# Patient Record
Sex: Female | Born: 1960 | Hispanic: No | Smoking: Never smoker
Health system: Southern US, Community
[De-identification: ages and names within clinical notes are randomized; demographics above are authoritative.]

## PROBLEM LIST (undated history)

## (undated) DIAGNOSIS — Z9989 Dependence on other enabling machines and devices: Secondary | ICD-10-CM

## (undated) DIAGNOSIS — G4733 Obstructive sleep apnea (adult) (pediatric): Secondary | ICD-10-CM

## (undated) DIAGNOSIS — E785 Hyperlipidemia, unspecified: Secondary | ICD-10-CM

## (undated) DIAGNOSIS — M1990 Unspecified osteoarthritis, unspecified site: Secondary | ICD-10-CM

## (undated) DIAGNOSIS — K219 Gastro-esophageal reflux disease without esophagitis: Secondary | ICD-10-CM

## (undated) DIAGNOSIS — E079 Disorder of thyroid, unspecified: Secondary | ICD-10-CM

## (undated) DIAGNOSIS — D649 Anemia, unspecified: Secondary | ICD-10-CM

## (undated) DIAGNOSIS — I10 Essential (primary) hypertension: Secondary | ICD-10-CM

## (undated) HISTORY — DX: Anemia, unspecified: D64.9

## (undated) HISTORY — DX: Gastro-esophageal reflux disease without esophagitis: K21.9

## (undated) HISTORY — DX: Disorder of thyroid, unspecified: E07.9

## (undated) HISTORY — DX: Obstructive sleep apnea (adult) (pediatric): G47.33

## (undated) HISTORY — DX: Dependence on other enabling machines and devices: Z99.89

## (undated) HISTORY — PX: ABDOMINAL HYSTERECTOMY: SHX81

## (undated) HISTORY — DX: Essential (primary) hypertension: I10

## (undated) HISTORY — DX: Unspecified osteoarthritis, unspecified site: M19.90

## (undated) HISTORY — DX: Hyperlipidemia, unspecified: E78.5

---

## 2012-01-13 HISTORY — PX: DIAGNOSTIC LAPAROSCOPY: SUR761

## 2012-02-12 HISTORY — PX: LYSIS OF ADHESION: SHX5961

## 2013-10-02 ENCOUNTER — Ambulatory Visit (INDEPENDENT_AMBULATORY_CARE_PROVIDER_SITE_OTHER): Payer: Managed Care, Other (non HMO) | Admitting: General Surgery

## 2013-10-10 ENCOUNTER — Other Ambulatory Visit (INDEPENDENT_AMBULATORY_CARE_PROVIDER_SITE_OTHER): Payer: Self-pay | Admitting: General Surgery

## 2013-10-10 ENCOUNTER — Encounter (INDEPENDENT_AMBULATORY_CARE_PROVIDER_SITE_OTHER): Payer: Self-pay | Admitting: General Surgery

## 2013-10-10 ENCOUNTER — Ambulatory Visit (INDEPENDENT_AMBULATORY_CARE_PROVIDER_SITE_OTHER): Payer: Managed Care, Other (non HMO) | Admitting: General Surgery

## 2013-10-10 VITALS — BP 122/74 | HR 72 | Temp 99.0°F | Resp 16 | Ht 61.5 in | Wt 217.0 lb

## 2013-10-10 LAB — CBC WITH DIFFERENTIAL/PLATELET
BASOS ABS: 0 10*3/uL (ref 0.0–0.1)
BASOS PCT: 0 % (ref 0–1)
Eosinophils Absolute: 0.2 10*3/uL (ref 0.0–0.7)
Eosinophils Relative: 4 % (ref 0–5)
HCT: 34.8 % — ABNORMAL LOW (ref 36.0–46.0)
Hemoglobin: 11.6 g/dL — ABNORMAL LOW (ref 12.0–15.0)
Lymphocytes Relative: 48 % — ABNORMAL HIGH (ref 12–46)
Lymphs Abs: 2.4 10*3/uL (ref 0.7–4.0)
MCH: 25.8 pg — ABNORMAL LOW (ref 26.0–34.0)
MCHC: 33.3 g/dL (ref 30.0–36.0)
MCV: 77.3 fL — ABNORMAL LOW (ref 78.0–100.0)
Monocytes Absolute: 0.4 10*3/uL (ref 0.1–1.0)
Monocytes Relative: 7 % (ref 3–12)
Neutro Abs: 2.1 10*3/uL (ref 1.7–7.7)
Neutrophils Relative %: 41 % — ABNORMAL LOW (ref 43–77)
Platelets: 277 10*3/uL (ref 150–400)
RBC: 4.5 MIL/uL (ref 3.87–5.11)
RDW: 16.4 % — AB (ref 11.5–15.5)
WBC: 5.1 10*3/uL (ref 4.0–10.5)

## 2013-10-10 LAB — CMP AND LIVER
ALK PHOS: 104 U/L (ref 39–117)
ALT: 8 U/L (ref 0–35)
AST: 12 U/L (ref 0–37)
Albumin: 3.8 g/dL (ref 3.5–5.2)
BUN: 12 mg/dL (ref 6–23)
Bilirubin, Direct: <0.1 mg/dL (ref 0.0–0.3)
CO2: 27 mEq/L (ref 19–32)
CREATININE: 0.64 mg/dL (ref 0.50–1.10)
Calcium: 8.9 mg/dL (ref 8.4–10.5)
Chloride: 103 mEq/L (ref 96–112)
GLUCOSE: 89 mg/dL (ref 70–99)
Potassium: 3.8 mEq/L (ref 3.5–5.3)
Sodium: 139 mEq/L (ref 135–145)
Total Bilirubin: 0.3 mg/dL (ref 0.2–1.2)
Total Protein: 7.5 g/dL (ref 6.0–8.3)

## 2013-10-10 LAB — HEMOGLOBIN A1C
Hgb A1c MFr Bld: 6.2 % — ABNORMAL HIGH (ref <5.7)
MEAN PLASMA GLUCOSE: 131 mg/dL — AB (ref <117)

## 2013-10-10 LAB — IRON AND TIBC
%SAT: 11 % — AB (ref 20–55)
IRON: 42 ug/dL (ref 42–145)
TIBC: 369 ug/dL (ref 250–470)
UIBC: 327 ug/dL (ref 125–400)

## 2013-10-10 LAB — LIPID PANEL
CHOL/HDL RATIO: 4.6 ratio
CHOLESTEROL: 278 mg/dL — AB (ref 0–200)
HDL: 60 mg/dL (ref >39)
LDL CALC: 198 mg/dL — AB (ref 0–99)
TRIGLYCERIDES: 98 mg/dL (ref <150)
VLDL: 20 mg/dL (ref 0–40)

## 2013-10-10 LAB — MAGNESIUM: Magnesium: 2 mg/dL (ref 1.5–2.5)

## 2013-10-10 NOTE — Patient Instructions (Signed)
Congratulations on starting your journey to a healthier life! Over the next few weeks you will be undergoing tests (x-rays and labs) and seeing specialists to help evaluate you for weight loss surgery.  These tests and consultations with a psychologist and nutritionist are needed to prepare you for the lifestyle changes that lie ahead and are often required by insurance companies to approve you for surgery.   Pathway to Surgery:  Over the next few weeks -->Lab work -->Radiology tests   - Chest x-ray - make sure your lungs are normal before surgery  - Upper GI - you drink barium and pictures are taken as it travels down your  esophagus and into your stomach - looks for reflux and a hiatal hernia which may  need to repaired at the same time as your weight loss surgery  - Abdominal Ultrasound - looks at your gallbladder and liver  - Mammogram - up to date mammogram if you are a female -->EKG  -->Sleep study - if you are felt to be at high risk for obstructive sleep apnea -->H. Pylori breath test ([employer]) - you surgeon may order this test to see if you have  a bacteria (H pylori) in your stomach which makes you at higher risk to develop a  ulcer or inflammation of your stomach -->Nutrition consultation -->Psychologist consultation -->Other specialist consults - your surgeon may determine that you need to see a  specialist like a cardiologist or pulmnologist depending on your health history -->Watch EMMI video about your planned weight loss surgery -->you can look at [email or web address] to learn more about weight loss surgery and  compare surgery outcomes -->you can look at our new website - [email or web address] - available mid-Aug 2015  Two weeks prior to surgery  Go on the extremely low carb liquid diet - this will decrease the size of your liver  which will make surgery safer - the nutritionist will go over this at a later date  Attend preoperative appointment with your surgeon  Attend  preoperative surgery class  One week prior to surgery  No aspirin products.  Tylenol is acceptable   24 hours prior to surgery  No alcoholic beverages  Report fever greater than 100.5 or excessive nasal drainage suggesting infection  Continue bariatric preop diet  Perform bowel prep if ordered  Do not eat or drink anything after midnight the night before surgery  Do not take any medications except those instructed by the anesthesiologist  Morning of surgery  Please arrive at the hospital at least 2 hours before your scheduled surgery time.  No makeup, fingernail polish or jewelry  Bring insurance cards with you  Bring your CPAP mask if you use this

## 2013-10-10 NOTE — Progress Notes (Signed)
Patient ID: Alona BeneJoyce A. Moch, female   DOB: [birth year], 53 y.o.   MRN: [id number]  Chief Complaint  Patient presents with  . Obesity    new bariatric- eval gastric bypass   PCP Dr Vilinda Boehringer[facility]jandro Hagg [facility] [place] [phone] HPI Louie BostonJoyce A. Pollyann KennedyMassenburg is a 53 y.o. female.   HPI 53 year old morbidly obese African American female referred by Dr. Vilinda BoehringerAlejandro Hagg For evaluation of morbid obesity and weight loss surgery. The patient has struggled with her weight for many years. Despite numerous attempts for sustained weight loss she has been unsuccessful. She has tried Atkins, [employer] Wat[employer], [place] weight loss, the zone diet, cabbage diet, over-the-counter medications, physician supervised weight loss programs, phentermine, and Qsymia. She had undergone the [facility] [place] [place] however her insurance does not cover that program. She underwent an upper endoscopy as well as nutrition and psychological consult.  She did our Futures traderonline seminar. She is most interested in the gastric bypass. She has had several acquaintances who have had gastric bypass surgery and did well with that.  She has a daughter he was around 53 years of age. She does not smoke. Past Medical History  Diagnosis Date  . Anemia   . Arthritis   . GERD (gastroesophageal reflux disease)   . Hyperlipidemia   . Hypertension   . Thyroid disease     no longer on med; "remission"  . OSA on CPAP     Past Surgical History  Procedure Laterality Date  . Abdominal hysterectomy      abdominal- partial  . Diagnostic laparoscopy  01/2012    [facility]. extensive SB adhesions in pelvis  . Lysis of adhesion  02/2012    with BSO; [facility]     Family History  Problem Relation Age of Onset  . Cancer Father     lung cancer  . Cancer Sister     colon    Social History History  Substance Use Topics  . Smoking status: Never Smoker   . Smokeless  tobacco: Not on file  . Alcohol Use: No    Allergies  Allergen Reactions  . Lisinopril Cough  . Penicillins Swelling    & pain at site of injection--can take by mouth     Current Outpatient Prescriptions  Medication Sig Dispense Refill  . Cholecalciferol (VITAMIN D3) 1000 UNITS CAPS Take by mouth daily.      . Cyanocobalamin (RA VITAMIN B-12 TR) 1000 MCG TBCR Take by mouth daily.      Marland Kitchen. docusate sodium (COLACE) 100 MG capsule Take by mouth as needed.      Marland Kitchen. HYDROcodone-acetaminophen (NORCO/VICODIN) 5-325 MG per tablet Take 1 tablet by mouth every 6 (six) hours as needed for moderate pain.      Marland Kitchen. lansoprazole (PREVACID) 15 MG capsule Take by mouth daily.      . methocarbamol (ROBAXIN) 500 MG tablet Take 500 mg by mouth 4 (four) times daily.      . metoprolol tartrate (LOPRESSOR) 25 MG tablet Take by mouth daily.      . Multiple Vitamins-Minerals (MULTIVITAMIN PO) Take by mouth.      . Omega-3 1000 MG CAPS Take by mouth daily.      . polyethylene glycol (MIRALAX / GLYCOLAX) packet Take by mouth as needed.      . traMADol (ULTRAM) 50 MG tablet Take by mouth as needed.      . traZODone (DESYREL) 50 MG tablet daily.      .Marland Kitchen  triamterene-hydrochlorothiazide (DYAZIDE) 37.5-25 MG per capsule Take by mouth daily.      Marland Kitchen venlafaxine XR (EFFEXOR-XR) 37.5 MG 24 hr capsule daily.       No current facility-administered medications for this visit.    Review of Systems Review of Systems  Constitutional: Negative for fever, activity change, appetite change and unexpected weight change.  HENT: Negative for nosebleeds and trouble swallowing.   Eyes: Negative for photophobia and visual disturbance.  Respiratory: Negative for chest tightness and shortness of breath.        +OSA on CPAP, epworth score 16  Cardiovascular: Negative for chest pain and leg swelling.       Denies CP, SOB, orthopnea, PND, DOE  Gastrointestinal: Positive for constipation. Negative for nausea, vomiting, abdominal pain,  diarrhea and blood in stool.       +reflux, on PPI, will sometimes have acid feeling in upper throat; prior colonoscopy -reports normal. Has BM once a week. Some intermittent abd discomfort  Endocrine:       States did have thyroid problem but no longer needs med per PCP per pt  Genitourinary: Negative for dysuria and difficulty urinating.  Musculoskeletal: Positive for back pain. Negative for arthralgias.       B/l knee, hip pain; low back pain  Skin: Negative for pallor and rash.  Neurological: Negative for dizziness, seizures, facial asymmetry and numbness.       Denies TIA and amaurosis fugax   Hematological: Negative for adenopathy. Does not bruise/bleed easily.       Denies blood clots  Psychiatric/Behavioral: Negative for behavioral problems and agitation.    Blood pressure 122/74, pulse 72, temperature 99 F (37.2 C), temperature source Oral, resp. rate 16, height 5' 1.5" (1.562 m), weight 217 lb (98.431 kg).  Physical Exam Physical Exam  Vitals reviewed. Constitutional: She is oriented to person, place, and time. She appears well-developed and well-nourished. No distress.  Morbidly obese; distributed  HENT:  Head: Normocephalic and atraumatic.  Right Ear: External ear normal.  Left Ear: External ear normal.  Eyes: Conjunctivae are normal. No scleral icterus.  Neck: Normal range of motion. Neck supple. No tracheal deviation present. No thyromegaly present.  Cardiovascular: Normal rate and normal heart sounds.   Pulmonary/Chest: Effort normal and breath sounds normal. No stridor. No respiratory distress. She has no wheezes.  Abdominal: Soft. She exhibits no distension. There is tenderness (mild) in the left lower quadrant. There is no rebound and no guarding.    Musculoskeletal: She exhibits no edema and no tenderness.  Lymphadenopathy:    She has no cervical adenopathy.  Neurological: She is alert and oriented to person, place, and time. She exhibits normal muscle tone.    Skin: Skin is warm and dry. No rash noted. She is not diaphoretic. No erythema.  Psychiatric: She has a normal mood and affect. Her behavior is normal. Judgment and thought content normal.    Data Reviewed Care everywhere -  [facility], bariatric surgeon, 07/24/12 initial visit note, BMI 40 Hospital records from [id number] - echo- ef 55-60%; no wall motion abnormalities, nml RV fxn. V/Q scan - negative; LE ultrasound negative. Admitted for abd pain - CT abd-pelv - GB ok. 1.9 cm left liver cyst. Some small bowel enteritis in pelvis.   Operative notes from [facility] from fall 2013  Patient underwent diagnostic laparoscopy on 01/31/2012 for persistent pelvic pain which showed severe adhesions of the small bowel to the anterior abdominal wall, left and right pelvic  sidewall and along bladder reflection.  She underwent exploratory laparotomy, lysis of adhesions, bilateral salpingo-oophorectomy on December 19.  Assessment    Morbid obesity BMI 40.39 Hypertension Dyslipidemia  Depression Gastroesophageal reflux disease Osteoarthritis with bilateral knee and bilateral hip pain Low back pain Obstructive sleep apnea on CPAP History of intra-abdominal adhesions    Plan    The patient meets weight loss surgery criteria. I think the patient would be an acceptable candidate for Laparoscopic Roux-en-Y Gastric bypass. However I did explain to the patient that given her prior history of extensive small bowel adhesive disease even though the adhesions were lysed that she could have recurrent adhesive disease that may make a gastric bypass challenging or higher risk. We briefly discussed the possibility that if she was found to have significant intra-abdominal adhesions the safer procedure at that point maybe to convert to a sleeve gastrectomy. I explained to her that I wanted her to think about it and we will discuss it further at her followup appointment with me.  We discussed  laparoscopic Roux-en-Y gastric bypass. We discussed the preoperative, operative and postoperative process. Using diagrams, I explained the surgery in detail including the performance of an EGD near the end of the surgery and an Upper GI swallow study on POD 1. We discussed the typical hospital course including a 2-3 day stay baring any complications.   The patient was given educational material. I quoted the patient that they can expect to lose 50-70% of their excess weight with the gastric bypass. We did discuss the possibility of weight regain several years after the procedure.  We discussed the risk and benefits of surgery including but not limited to anesthesia risk, bleeding, infection, anastomotic edema requiring a few additional days in the hospital, postop nausea, possible conversion to open procedure, blood clot formation, anastomotic leak, anastomotic stricture, ulcer formation, death, respiratory complications, intestinal blockage, internal hernia, gallstone formation, vitamin and nutritional deficiencies, hair loss, weight regain injury to surrounding structures, failure to lose weight and mood changes.  We discussed that before and after surgery that there would be an alteration in their diet. I explained that we have put them on a diet 2 weeks before surgery. I also explained that they would be on a liquid diet for 2 weeks after surgery. We discussed that they would have to avoid certain foods such as sugar after surgery. We discussed the importance of physical activity as well as compliance with our dietary and supplement recommendations and routine follow-up.  I explained to the patient that we will start our evaluation process which includes labs, nutritionist consultation, EKG, CXR. We will use a lot of the records from her workup and evaluation such as her psychology evaluation and clearance. She had an upper endoscopy which will suffice therefore we will not get an upper GI. We will try to  coordinate all these appointments since she she lives out of town.   The patient was given EMMI video access on laparoscopic roux en y gastric bypass.   ADDENDUM 10/22/13 See telephone note dated 8/11 regarding change in plan of procedure after discussion with pt regarding her abdominal surgery history - pt now prefers lap sleeve gastrectomy  Mary Sella. Andrey Campanile, MD, FACS General, Bariatric, & Minimally Invasive Surgery [facility], [place]        [facility] M 10/10/2013, 7:17 PM

## 2013-10-11 LAB — T4: T4 TOTAL: 9 ug/dL (ref 5.0–12.5)

## 2013-10-11 LAB — VITAMIN D 25 HYDROXY (VIT D DEFICIENCY, FRACTURES): Vit D, 25-Hydroxy: 31 ng/mL (ref 30–89)

## 2013-10-11 LAB — FOLATE: FOLATE: 17.9 ng/mL

## 2013-10-11 LAB — VITAMIN B12: VITAMIN B 12: 571 pg/mL (ref 211–911)

## 2013-10-11 LAB — H. PYLORI ANTIBODY, IGG: H Pylori IgG: 0.49 {ISR}

## 2013-10-11 LAB — TSH: TSH: 0.58 u[IU]/mL (ref 0.350–4.500)

## 2013-10-17 ENCOUNTER — Other Ambulatory Visit (INDEPENDENT_AMBULATORY_CARE_PROVIDER_SITE_OTHER): Payer: Self-pay | Admitting: General Surgery

## 2013-10-22 ENCOUNTER — Telehealth (INDEPENDENT_AMBULATORY_CARE_PROVIDER_SITE_OTHER): Payer: Self-pay | Admitting: General Surgery

## 2013-10-22 NOTE — Telephone Encounter (Signed)
Called pt to discuss outside operative notes. Pt [place] involving SB to abd wall, pelvis, and pelvis structures. Explained that gastric bypass should technically be feasible however couldn't guarantee possibility of recurrent adhesive disease. Explained that if significant adhesive disease the risk of enterotomy/injuring SB would be higher if contd with gastric. Discussed her thoughts about converting to sleeve at that point. She stated after review, she would like to proceed with sleeve. Informed her I would let our coordinator know.

## 2013-10-23 ENCOUNTER — Telehealth (INDEPENDENT_AMBULATORY_CARE_PROVIDER_SITE_OTHER): Payer: Self-pay

## 2013-10-23 NOTE — Telephone Encounter (Signed)
Dr. Tawana ScaleWilson's office note faxed to pt's PCP, Vilinda BoehringerAlejandro Hagg, MD.

## 2013-11-20 ENCOUNTER — Encounter: Payer: 59 | Attending: General Surgery | Admitting: Dietician

## 2013-11-20 ENCOUNTER — Ambulatory Visit (HOSPITAL_COMMUNITY)
Admission: RE | Admit: 2013-11-20 | Discharge: 2013-11-20 | Disposition: A | Discharge status: Home / Self Care | Payer: Medicare FFS | Source: Ambulatory Visit | Attending: General Surgery | Admitting: General Surgery

## 2013-11-20 ENCOUNTER — Other Ambulatory Visit (INDEPENDENT_AMBULATORY_CARE_PROVIDER_SITE_OTHER): Payer: Self-pay | Admitting: General Surgery

## 2013-11-20 ENCOUNTER — Encounter: Payer: Self-pay | Admitting: Dietician

## 2013-11-20 VITALS — Ht 61.5 in | Wt 217.0 lb

## 2013-11-20 DIAGNOSIS — M129 Arthropathy, unspecified: Secondary | ICD-10-CM | POA: Insufficient documentation

## 2013-11-20 DIAGNOSIS — R0602 Shortness of breath: Secondary | ICD-10-CM | POA: Insufficient documentation

## 2013-11-20 DIAGNOSIS — D649 Anemia, unspecified: Secondary | ICD-10-CM | POA: Diagnosis not present

## 2013-11-20 DIAGNOSIS — Z713 Dietary counseling and surveillance: Secondary | ICD-10-CM | POA: Insufficient documentation

## 2013-11-20 DIAGNOSIS — Z1231 Encounter for screening mammogram for malignant neoplasm of breast: Secondary | ICD-10-CM

## 2013-11-20 DIAGNOSIS — G4733 Obstructive sleep apnea (adult) (pediatric): Secondary | ICD-10-CM | POA: Insufficient documentation

## 2013-11-20 DIAGNOSIS — I10 Essential (primary) hypertension: Secondary | ICD-10-CM | POA: Diagnosis not present

## 2013-11-20 DIAGNOSIS — Z6841 Body Mass Index (BMI) 40.0 and over, adult: Secondary | ICD-10-CM | POA: Diagnosis not present

## 2013-11-20 DIAGNOSIS — E785 Hyperlipidemia, unspecified: Secondary | ICD-10-CM | POA: Insufficient documentation

## 2013-11-20 DIAGNOSIS — K219 Gastro-esophageal reflux disease without esophagitis: Secondary | ICD-10-CM | POA: Diagnosis not present

## 2013-11-20 NOTE — Progress Notes (Signed)
  Pre-Op Assessment Visit:  Pre-Operative RYGB Surgery  Medical Nutrition Therapy:  Appt start time: 1015   End time:  1100.  Patient was seen on 11/17/2013 for Pre-Operative RYGB Nutrition Assessment. Assessment and letter of approval faxed to [facility] Bariatric Surgery Program coordinator on 11/20/2013.   Preferred Learning Style:   No preference indicated   Learning Readiness:   Ready  Handouts given during visit include:  Pre-Op Goals Bariatric Surgery Protein Shakes  Supplements given during visit include:  2 BariActiv MVI lot # [id number] S, exp 5/16 2 BariActiv Iron lot # [id number] S, exp 5/16 2 BariActiv Calcium Citrate lot # [id number] S, exp 6/16  Teaching Method Utilized:  Visual Auditory Hands on  Barriers to learning/adherence to lifestyle change: limited mobility  Demonstrated degree of understanding via:  Teach Back   Patient to call the [facility] to enroll in Pre-Op and Post-Op Nutrition Education when surgery date is scheduled.

## 2013-11-20 NOTE — Patient Instructions (Signed)
Follow Pre-Op Goals Try Protein Shakes Call [facility] at [phone] when surgery is scheduled to enroll in Pre-Op Class 

## 2015-08-04 IMAGING — CR DG CHEST 2V
2 series · 2 of 2 positions shown · non-contrast
Comparison: None.

CLINICAL DATA: Bariatric screening. Shortness of breath,
hypertension.

EXAM:
CHEST  2 VIEW

[view not recorded (1 of 2)]
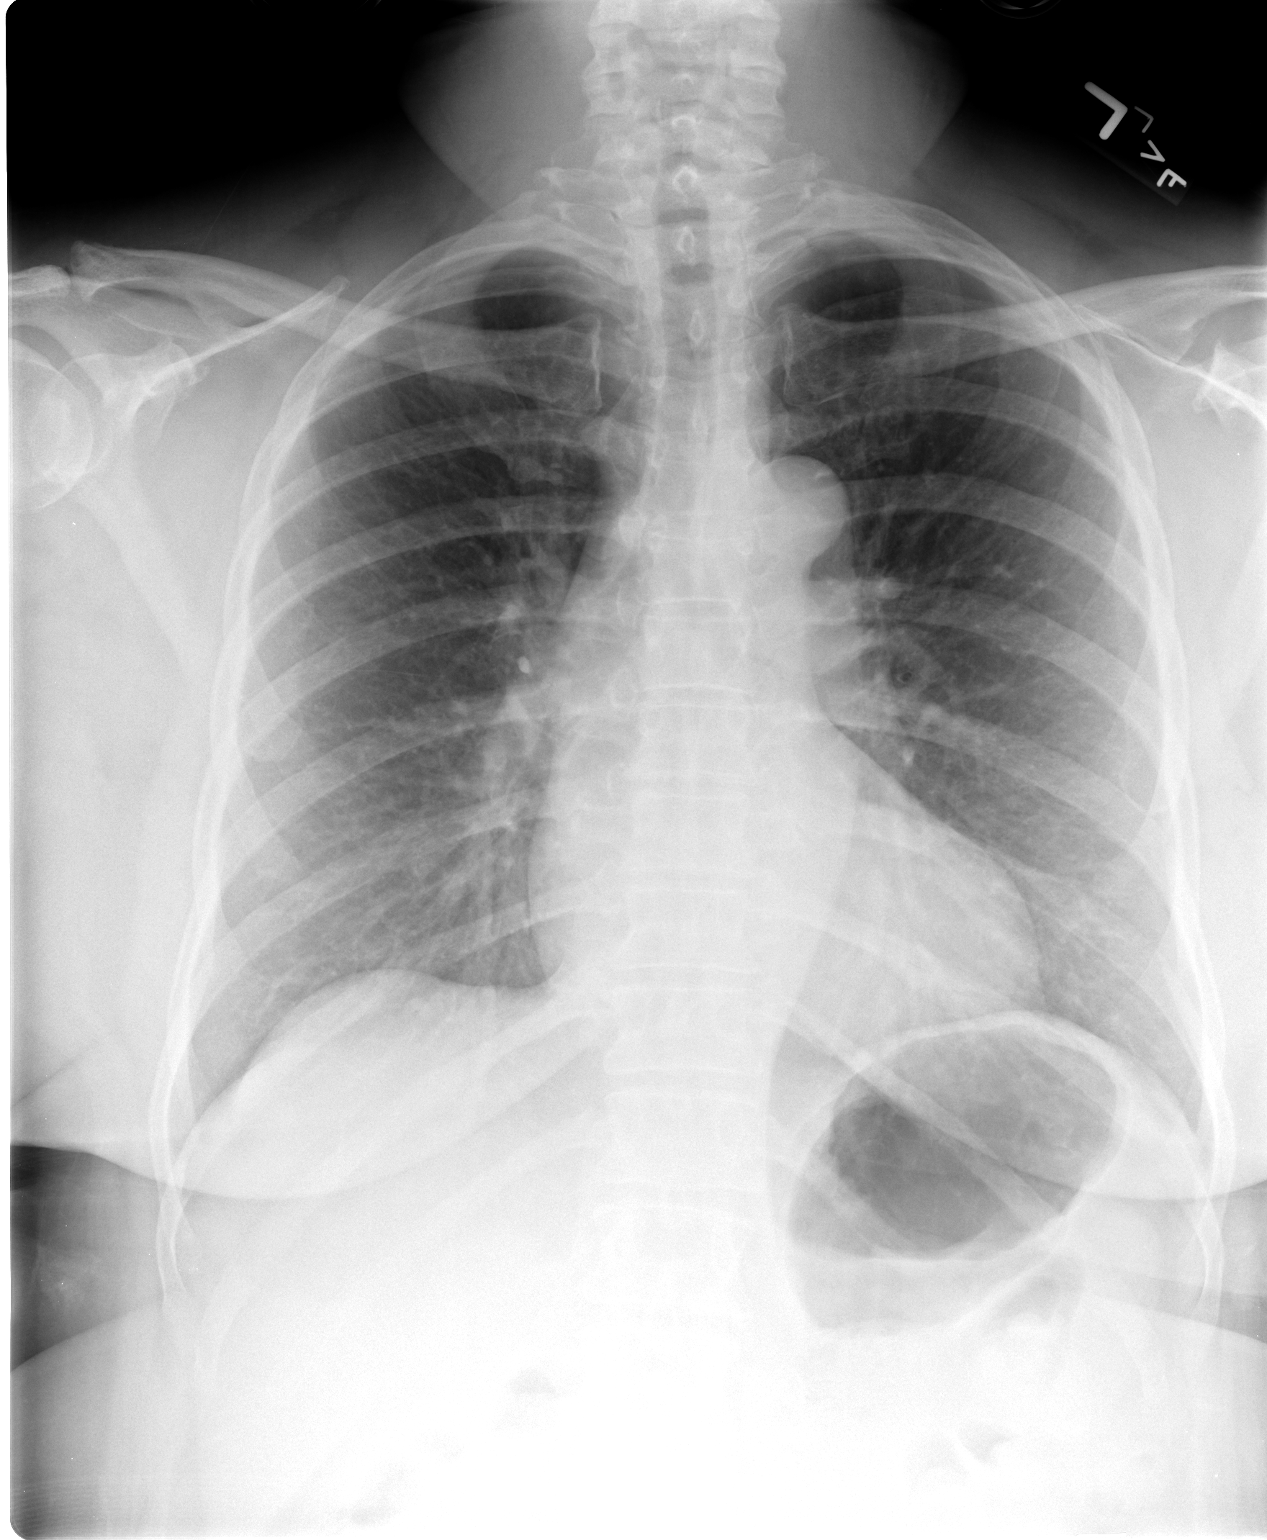

[view not recorded (2 of 2)]
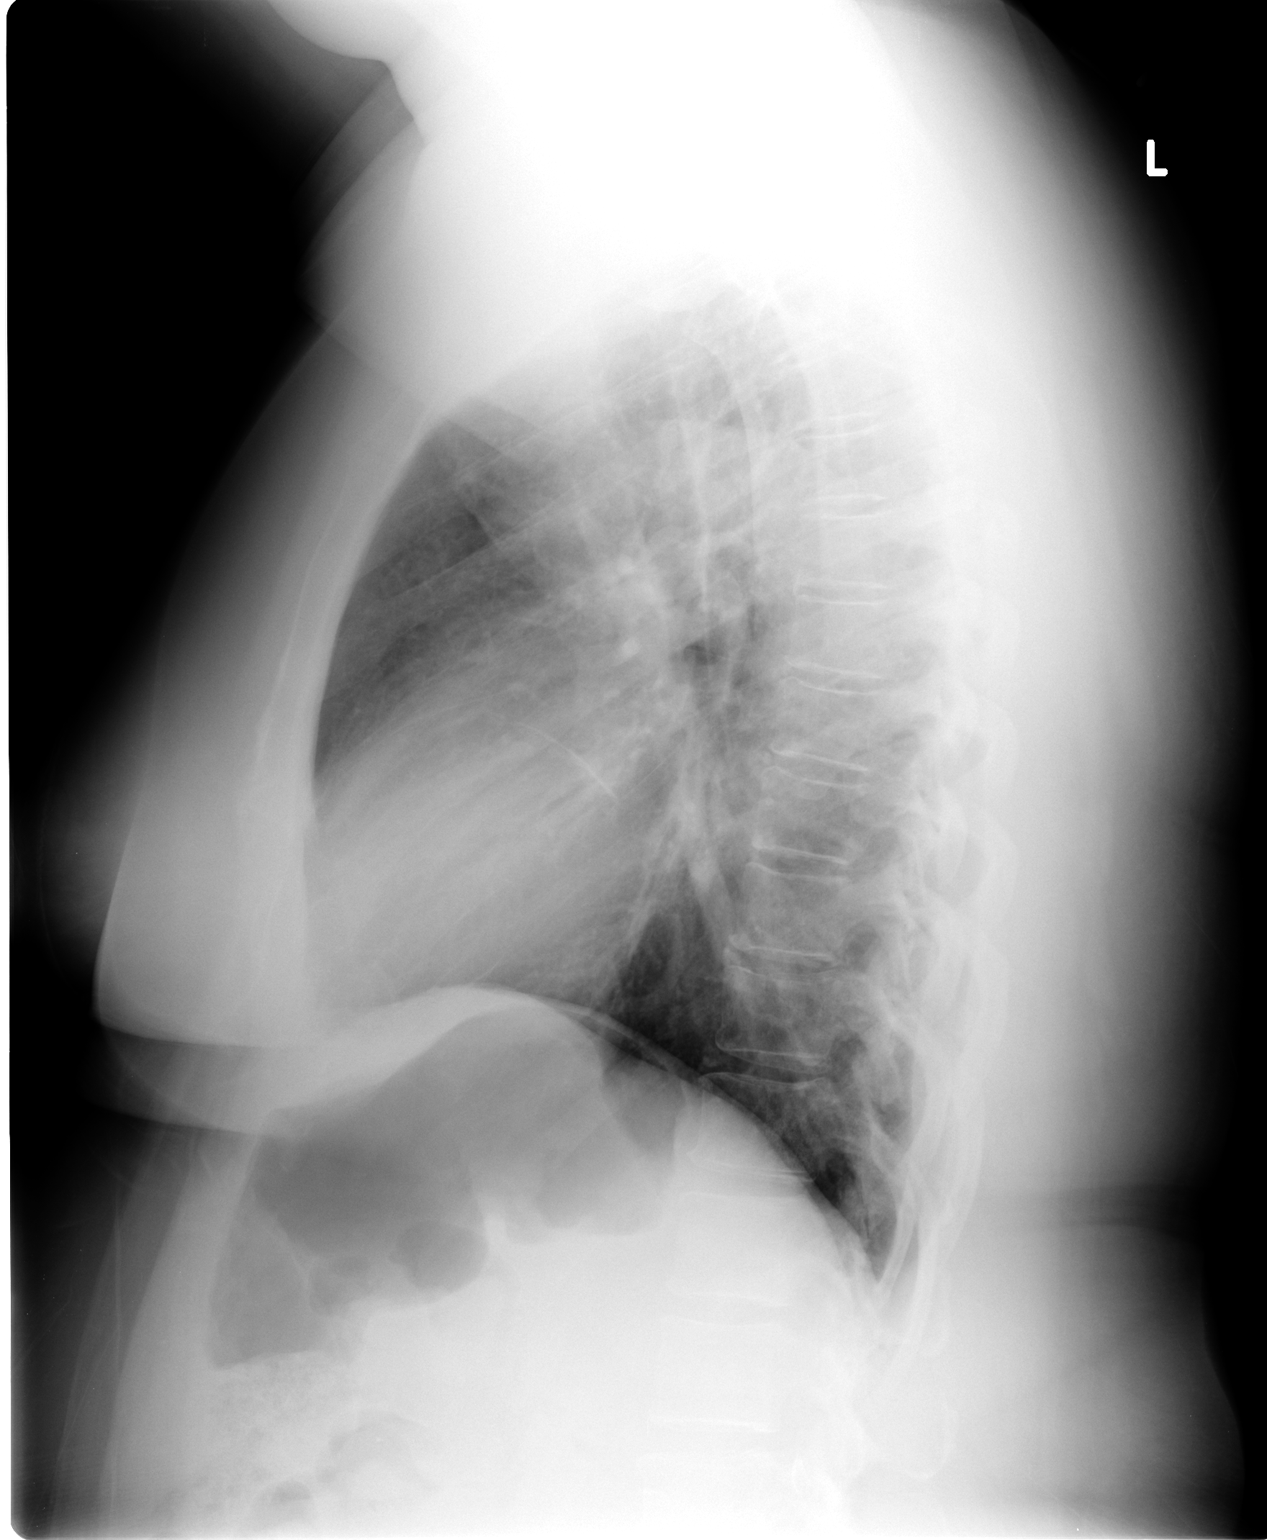

[2 of 2 positions shown; findings below may reference images not displayed]

FINDINGS: The heart size and mediastinal contours are within normal limits.
Both lungs are clear. The visualized skeletal structures are
unremarkable.
IMPRESSION: No active cardiopulmonary disease.
